# Patient Record
Sex: Male | Born: 1965 | Race: White | Hispanic: No | Marital: Married | Smoking: Never smoker
Health system: Southern US, Community
[De-identification: ages and names within clinical notes are randomized; demographics above are authoritative.]

## PROBLEM LIST (undated history)

## (undated) DIAGNOSIS — J9383 Other pneumothorax: Secondary | ICD-10-CM

## (undated) DIAGNOSIS — C801 Malignant (primary) neoplasm, unspecified: Secondary | ICD-10-CM

## (undated) DIAGNOSIS — E785 Hyperlipidemia, unspecified: Secondary | ICD-10-CM

## (undated) DIAGNOSIS — Z889 Allergy status to unspecified drugs, medicaments and biological substances status: Secondary | ICD-10-CM

## (undated) HISTORY — PX: TONSILLECTOMY: SUR1361

## (undated) HISTORY — PX: VASECTOMY: SHX75

## (undated) HISTORY — DX: Hyperlipidemia, unspecified: E78.5

## (undated) HISTORY — PX: KNEE ARTHROSCOPY: SHX127

## (undated) HISTORY — PX: BICEPS TENDON REPAIR: SHX566

## (undated) HISTORY — PX: ROTATOR CUFF REPAIR: SHX139

## (undated) HISTORY — PX: HERNIA REPAIR: SHX51

---

## 2000-04-17 ENCOUNTER — Other Ambulatory Visit: Admission: RE | Admit: 2000-04-17 | Discharge: 2000-04-17 | Payer: Self-pay | Admitting: Urology

## 2002-05-26 ENCOUNTER — Encounter: Admission: RE | Admit: 2002-05-26 | Discharge: 2002-06-29 | Payer: Self-pay | Admitting: *Deleted

## 2012-10-30 ENCOUNTER — Other Ambulatory Visit: Payer: Self-pay | Admitting: Specialist

## 2012-10-30 DIAGNOSIS — M25561 Pain in right knee: Secondary | ICD-10-CM

## 2012-11-03 ENCOUNTER — Ambulatory Visit
Admission: RE | Admit: 2012-11-03 | Discharge: 2012-11-03 | Disposition: A | Discharge status: Home / Self Care | Payer: Managed Care, Other (non HMO) | Source: Ambulatory Visit | Attending: Specialist | Admitting: Specialist

## 2012-11-03 DIAGNOSIS — M25561 Pain in right knee: Secondary | ICD-10-CM

## 2016-01-31 ENCOUNTER — Other Ambulatory Visit: Payer: Self-pay | Admitting: Urology

## 2016-03-04 ENCOUNTER — Encounter (HOSPITAL_COMMUNITY)
Admission: RE | Admit: 2016-03-04 | Discharge: 2016-03-04 | Disposition: A | Discharge status: Home / Self Care | Payer: 59 | Source: Ambulatory Visit | Attending: Urology | Admitting: Urology

## 2016-03-04 ENCOUNTER — Ambulatory Visit (HOSPITAL_COMMUNITY)
Admission: RE | Admit: 2016-03-04 | Discharge: 2016-03-04 | Disposition: A | Discharge status: Home / Self Care | Payer: 59 | Source: Ambulatory Visit | Attending: Urology | Admitting: Urology

## 2016-03-04 ENCOUNTER — Encounter (HOSPITAL_COMMUNITY): Payer: Self-pay

## 2016-03-04 DIAGNOSIS — C61 Malignant neoplasm of prostate: Secondary | ICD-10-CM

## 2016-03-04 DIAGNOSIS — Z01812 Encounter for preprocedural laboratory examination: Secondary | ICD-10-CM

## 2016-03-04 DIAGNOSIS — R001 Bradycardia, unspecified: Secondary | ICD-10-CM

## 2016-03-04 DIAGNOSIS — Z01810 Encounter for preprocedural cardiovascular examination: Secondary | ICD-10-CM | POA: Insufficient documentation

## 2016-03-04 DIAGNOSIS — Z01818 Encounter for other preprocedural examination: Secondary | ICD-10-CM | POA: Insufficient documentation

## 2016-03-04 HISTORY — DX: Allergy status to unspecified drugs, medicaments and biological substances: Z88.9

## 2016-03-04 HISTORY — DX: Other pneumothorax: J93.83

## 2016-03-04 HISTORY — DX: Malignant (primary) neoplasm, unspecified: C80.1

## 2016-03-04 LAB — CBC
HEMATOCRIT: 46.4 % (ref 39.0–52.0)
Hemoglobin: 15.9 g/dL (ref 13.0–17.0)
MCH: 30.3 pg (ref 26.0–34.0)
MCHC: 34.3 g/dL (ref 30.0–36.0)
MCV: 88.5 fL (ref 78.0–100.0)
PLATELETS: 174 10*3/uL (ref 150–400)
RBC: 5.24 MIL/uL (ref 4.22–5.81)
RDW: 13.1 % (ref 11.5–15.5)
WBC: 7.5 10*3/uL (ref 4.0–10.5)

## 2016-03-04 LAB — BASIC METABOLIC PANEL
Anion gap: 7 (ref 5–15)
BUN: 19 mg/dL (ref 6–20)
CALCIUM: 9.2 mg/dL (ref 8.9–10.3)
CO2: 27 mmol/L (ref 22–32)
CREATININE: 1.14 mg/dL (ref 0.61–1.24)
Chloride: 106 mmol/L (ref 101–111)
GFR calc Af Amer: >60 mL/min (ref >60)
Glucose, Bld: 96 mg/dL (ref 65–99)
POTASSIUM: 4.3 mmol/L (ref 3.5–5.1)
SODIUM: 140 mmol/L (ref 135–145)

## 2016-03-04 LAB — ABO/RH: ABO/RH(D): O NEG

## 2016-03-04 NOTE — Patient Instructions (Addendum)
Gilbert Norris  03/04/2016   Your procedure is scheduled on: 03-07-16  Report to [facility]  Entrance take [facility]  elevators to 3rd floor to  [place] [facility] [address]  AM.  Call this number if you have problems the morning of surgery [phone]  Bowel prep day before surgery(Magnesium Citrate and use 1 Fleet enema). Drink Clear liquids only plentiful.  Remember: ONLY 1 PERSON MAY GO WITH YOU TO SHORT STAY TO GET  READY MORNING OF YOUR SURGERY.  Do not eat food or drink liquids :After Midnight.     Take these medicines the morning of surgery with A SIP OF WATER:  Allegra -if need. DO NOT TAKE ANY DIABETIC MEDICATIONS DAY OF YOUR SURGERY                               You may not have any metal on your body including hair pins and              piercings  Do not wear jewelry, make-up, lotions, powders or perfumes, deodorant             Do not wear nail polish.  Do not shave  48 hours prior to surgery.              Men may shave face and neck.   Do not bring valuables to the hospital. [place].  Contacts, dentures or bridgework may not be worn into surgery.  Leave suitcase in the car. After surgery it may be brought to your room.     Patients discharged the day of surgery will not be allowed to drive home.  Name and phone number of your driver: Maudie Mercury- spouse [phone] cell  Special Instructions: N/A              Please read over the following fact sheets you were given: _____________________________________________________________________             [facility] - Preparing for Surgery Before surgery, you can play an important role.  Because skin is not sterile, your skin needs to be as free of germs as possible.  You can reduce the number of germs on your skin by washing with CHG (chlorahexidine gluconate) soap before surgery.  CHG is an antiseptic cleaner which kills germs and bonds with the  skin to continue killing germs even after washing. Please DO NOT use if you have an allergy to CHG or antibacterial soaps.  If your skin becomes reddened/irritated stop using the CHG and inform your nurse when you arrive at Short Stay. Do not shave (including legs and underarms) for at least 48 hours prior to the first CHG shower.  You may shave your face/neck. Please follow these instructions carefully:  1.  Shower with CHG Soap the night before surgery and the  morning of Surgery.  2.  If you choose to wash your hair, wash your hair first as usual with your  normal  shampoo.  3.  After you shampoo, rinse your hair and body thoroughly to remove the  shampoo.                           4.  Use CHG as you would any other liquid soap.  You can apply chg directly  to the skin and wash                       Gently with a scrungie or clean washcloth.  5.  Apply the CHG Soap to your body ONLY FROM THE NECK DOWN.   Do not use on face/ open                           Wound or open sores. Avoid contact with eyes, ears mouth and genitals (private parts).                       Wash face,  Genitals (private parts) with your normal soap.             6.  Wash thoroughly, paying special attention to the area where your surgery  will be performed.  7.  Thoroughly rinse your body with warm water from the neck down.  8.  DO NOT shower/wash with your normal soap after using and rinsing off  the CHG Soap.                9.  Pat yourself dry with a clean towel.            10.  Wear clean pajamas.            11.  Place clean sheets on your bed the night of your first shower and do not  sleep with pets. Day of Surgery : Do not apply any lotions/deodorants the morning of surgery.  Please wear clean clothes to the hospital/surgery center.  FAILURE TO FOLLOW THESE INSTRUCTIONS MAY RESULT IN THE CANCELLATION OF YOUR SURGERY PATIENT SIGNATURE_________________________________  NURSE  SIGNATURE__________________________________  ________________________________________________________________________   Adam Phenix  An incentive spirometer is a tool that can help keep your lungs clear and active. This tool measures how well you are filling your lungs with each breath. Taking long deep breaths may help reverse or decrease the chance of developing breathing (pulmonary) problems (especially infection) following:  A long period of time when you are unable to move or be active. BEFORE THE PROCEDURE   If the spirometer includes an indicator to show your best effort, your nurse or respiratory therapist will set it to a desired goal.  If possible, sit up straight or lean slightly forward. Try not to slouch.  Hold the incentive spirometer in an upright position. INSTRUCTIONS FOR USE  1. Sit on the edge of your bed if possible, or sit up as far as you can in bed or on a chair. 2. Hold the incentive spirometer in an upright position. 3. Breathe out normally. 4. Place the mouthpiece in your mouth and seal your lips tightly around it. 5. Breathe in slowly and as deeply as possible, raising the piston or the ball toward the top of the column. 6. Hold your breath for 3-5 seconds or for as long as possible. Allow the piston or ball to fall to the bottom of the column. 7. Remove the mouthpiece from your mouth and breathe out normally. 8. Rest for a few seconds and repeat Steps 1 through 7 at least 10 times every 1-2 hours when you are awake. Take your time and take a few normal breaths between deep breaths. 9. The spirometer may include an indicator to show your best  effort. Use the indicator as a goal to work toward during each repetition. 10. After each set of 10 deep breaths, practice coughing to be sure your lungs are clear. If you have an incision (the cut made at the time of surgery), support your incision when coughing by placing a pillow or rolled up towels firmly  against it. Once you are able to get out of bed, walk around indoors and cough well. You may stop using the incentive spirometer when instructed by your caregiver.  RISKS AND COMPLICATIONS  Take your time so you do not get dizzy or light-headed.  If you are in pain, you may need to take or ask for pain medication before doing incentive spirometry. It is harder to take a deep breath if you are having pain. AFTER USE  Rest and breathe slowly and easily.  It can be helpful to keep track of a log of your progress. Your caregiver can provide you with a simple table to help with this. If you are using the spirometer at home, follow these instructions: [place] IF:   You are having difficultly using the spirometer.  You have trouble using the spirometer as often as instructed.  Your pain medication is not giving enough relief while using the spirometer.  You develop fever of 100.5 F (38.1 C) or higher. SEEK IMMEDIATE MEDICAL CARE IF:   You cough up bloody sputum that had not been present before.  You develop fever of 102 F (38.9 C) or greater.  You develop worsening pain at or near the incision site. MAKE SURE YOU:   Understand these instructions.  Will watch your condition.  Will get help right away if you are not doing well or get worse. Document Released: 09/09/2006 Document Revised: 07/22/2011 Document Reviewed: 11/10/2006 [facility] Patient Information 2014 [place], [place].   ________________________________________________________________________

## 2016-03-04 NOTE — Pre-Procedure Instructions (Signed)
EKG done/ CXR done today per MD order.

## 2016-03-06 ENCOUNTER — Encounter (HOSPITAL_COMMUNITY): Payer: Self-pay | Admitting: Anesthesiology

## 2016-03-06 NOTE — Anesthesia Preprocedure Evaluation (Addendum)
Anesthesia Evaluation  Patient identified by MRN, date of birth, ID band Patient awake    Reviewed: Allergy & Precautions, NPO status , Patient's Chart, lab work & pertinent test results  Airway Mallampati: I  TM Distance: >3 FB Neck ROM: Full    Dental  (+) Teeth Intact, Dental Advisory Given   Pulmonary neg pulmonary ROS,    breath sounds clear to auscultation       Cardiovascular negative cardio ROS   Rhythm:Regular Rate:Normal     Neuro/Psych negative neurological ROS  negative psych ROS   GI/Hepatic negative GI ROS, Neg liver ROS,   Endo/Other  negative endocrine ROS  Renal/GU negative Renal ROS  negative genitourinary   Musculoskeletal negative musculoskeletal ROS (+)   Abdominal   Peds negative pediatric ROS (+)  Hematology negative hematology ROS (+)   Anesthesia Other Findings   Reproductive/Obstetrics negative OB ROS                            Lab Results  Component Value Date   WBC 7.5 03/04/2016   HGB 15.9 03/04/2016   HCT 46.4 03/04/2016   MCV 88.5 03/04/2016   PLT 174 03/04/2016   Lab Results  Component Value Date   CREATININE 1.14 03/04/2016   BUN 19 03/04/2016   NA 140 03/04/2016   K 4.3 03/04/2016   CL 106 03/04/2016   CO2 27 03/04/2016   No results found for: INR, PROTIME  EKG: sinus bradycardia, 1st degree AV block.  Anesthesia Physical Anesthesia Plan  ASA: II  Anesthesia Plan: General   Post-op Pain Management:    Induction: Intravenous  Airway Management Planned: Oral ETT  Additional Equipment:   Intra-op Plan:   Post-operative Plan: Extubation in OR  Informed Consent: I have reviewed the patients History and Physical, chart, labs and discussed the procedure including the risks, benefits and alternatives for the proposed anesthesia with the patient or authorized representative who has indicated his/her understanding and acceptance.    Dental advisory given  Plan Discussed with: CRNA  Anesthesia Plan Comments:         Anesthesia Quick Evaluation

## 2016-03-06 NOTE — H&P (Signed)
Office Visit Report     02/13/2016   --------------------------------------------------------------------------------   Gilbert Norris  MRN: [id number]  PRIMARY CARE:    DOB: 12/27/1965, 50 year old Male  REFERRING:  Raynelle Bring, MD  SSN: [id number]  PROVIDER:  Kathie Rhodes, M.D.    TREATING:  Raynelle Bring, M.D.    LOCATION:  [facility] [phone]   --------------------------------------------------------------------------------   CC/HPI: CC: Prostate Cancer   Physician requesting consult: Dr. Kathie Rhodes  PCP: Dr. Delfino Lovett   Gilbert Norris is a 50 year old gentleman who was found to have an elevated PSA of 4.27 and a prostate nodule at the left apex prompting a TRUS biopsy of the prostate on 01/03/16. This confirmed Gleason 3+4=7 adenocarcinoma of the prostate with 2 out of 12 biopsy cores positive for malignancy.   He is on testosterone replacement therapy with compounded testosterone gel. He began this 8 months ago for symptoms including severe fatigue, libido, erectile dysfunction, and poor exercise endurance. He has noted a significant improvement with all of these symptoms. His baseline testosterone was approximately 235. He has recently stopped testosterone replacement therapy considering his prostate cancer diagnosis.   Family history: None   Imaging studies: None   PMH: He has a history of hypercholesterolemia.  PSH: Inguinal hernia as a small child.   TNM stage: cT2a Nx Mx (L apex)  PSA: 4.27  Gleason score: 3+4=7  Biopsy (01/03/16): 2/12 cores positive  Left: L lateral mid (40%, 3+4=7, PNI), L mid (< 5%, 3+3=6)  Right: Benign  Prostate volume: 33/1 cc   Nomogram  OC disease: 53%  EPE: 47%  SVI: 3%  LNI: 3%  PFS (5 year, 10 year): 89%,81%   Urinary function: IPSS is 1.  Erectile function: SHIM score is 19. He is on testosterone replacement therapy and his erectile function is much improved. He previously took a very small amount of Levitra  prior to testosterone replacement therapy but no longer requires this medication.     ALLERGIES: None   MEDICATIONS: Crestor 20 mg tablet  Levitra 20 mg tablet     GU PSH: Prostate Needle Biopsy - 01/03/2016    NON-GU PSH: Knee Arthroscopy/surgery, Left Rotator Cuff Surgery, Right Surgical Pathology, Gross And Microscopic Examination For Prostate Needle - 01/03/2016    GU PMH: Prostate Cancer (Stable), I went over his pathology report with him today as well as the significance of his Gleason score, number and location of cores positive and percent of cores positive. We then discussed his Partin table results in detail and the significance of these predictions as far as prognosis and need for further workup are concerned. I then discussed with him the various options available including active surveillance and treatment for cure such as radiation and surgery. We briefly discussed the forms of radiation available. I also gave him written information outlining the disease, its workup and the options for treatment for him to review further. - 01/10/2016 Nodular prostate w/o LUTS, Left, There was a subtle firmness to the apex of the prostate on the left-hand side - 11/27/2015 Testicular hypofunction, He appears to have hypogonadism that is currently well controlled with a compounded testosterone product that I have recommended he remain on at this time. - 11/27/2015      PMH Notes: Adenocarcinoma prostate:   NON-GU PMH: Muscle weakness (generalized) - 02/06/2016, Muscle weakness (generalized) - 01/29/2016 Other lack of coordination - 02/06/2016 Hypertension    FAMILY HISTORY: Additional heart attack (anterior  wall) - Father   SOCIAL HISTORY: Marital Status: Married Current Smoking Status: Patient has never smoked.  Social Drinker.  Drinks 2 caffeinated drinks per day.    REVIEW OF SYSTEMS:    GU Review Male:   Patient denies frequent urination, hard to postpone urination, burning/ pain with  urination, get up at night to urinate, leakage of urine, stream starts and stops, trouble starting your streams, and have to strain to urinate .  Gastrointestinal (Upper):   Patient denies nausea and vomiting.  Gastrointestinal (Lower):   Patient denies diarrhea and constipation.  Constitutional:   Patient denies fever, night sweats, weight loss, and fatigue.  Skin:   Patient denies itching and skin rash/ lesion.  Eyes:   Patient denies blurred vision and double vision.  Ears/ Nose/ Throat:   Patient denies sore throat and sinus problems.  Hematologic/Lymphatic:   Patient denies swollen glands and easy bruising.  Cardiovascular:   Patient denies leg swelling and chest pains.  Respiratory:   Patient denies cough and shortness of breath.  Endocrine:   Patient denies excessive thirst.  Musculoskeletal:   Patient denies back pain and joint pain.  Neurological:   Patient denies headaches and dizziness.  Psychologic:   Patient denies depression and anxiety.   VITAL SIGNS:      02/13/2016 08:58 AM  BP 142/83 mmHg  Pulse 62 /min   MULTI-SYSTEM PHYSICAL EXAMINATION:    Constitutional: Well-nourished. No physical deformities. Normally developed. Good grooming.  Neck: Neck symmetrical, not swollen. Normal tracheal position.  Respiratory: Clear bilaterally.  Cardiovascular: Normal temperature, normal extremity pulses, no swelling, no varicosities. Regular rate and rhythm.  Lymphatic: No enlargement of neck, axillae, groin.  Skin: No paleness, no jaundice, no cyanosis. No lesion, no ulcer, no rash.  Neurologic / Psychiatric: Oriented to time, oriented to place, oriented to person. No depression, no anxiety, no agitation.  Gastrointestinal: No mass, no tenderness, no rigidity, non obese abdomen.  Eyes: Normal conjunctivae. Normal eyelids.  Ears, Nose, Mouth, and Throat: Left ear no scars, no lesions, no masses. Right ear no scars, no lesions, no masses. Nose no scars, no lesions, no masses. Normal  hearing. Normal lips.  Musculoskeletal: Normal gait and station of head and neck.     PAST DATA REVIEWED:  Source Of History:  Patient  Lab Test Review:   PSA  Records Review:   Pathology Reports, Previous Patient Records  Urine Test Review:   Urinalysis   01/10/16  Hormones  Testosterone, Total 407.2 pg/dL    PROCEDURES:          Urinalysis - 81003 Dipstick Dipstick Cont'd  Specimen: Voided Bilirubin: Neg  Color: Yellow Ketones: Neg  Appearance: Clear Blood: Neg  Specific Gravity: 1.025 Protein: Neg  pH: 5.0 Urobilinogen: 0.2  Glucose: Neg Nitrites: Neg    Leukocyte Esterase: Neg    ASSESSMENT:      ICD-10 Details  1 GU:   Prostate Cancer - C61    PLAN:           Schedule Return Visit: Keep Scheduled Appointment          Document Letter(s):  Created for Patient: Clinical Summary         Notes:   1. Prostate cancer: I had a detailed discussion with Gilbert Norris and his wife, Maudie Mercury, today. He is very well-informed and previously worked as a Chiropractor in surgery. He has done extensive research and has decided to proceed with surgical therapy and a robotic prostatectomy.  The patient was counseled about the natural history of prostate cancer and the standard treatment options that are available for prostate cancer. It was explained to him how his age and life expectancy, clinical stage, Gleason score, and PSA affect his prognosis, the decision to proceed with additional staging studies, as well as how that information influences recommended treatment strategies. We discussed the roles for active surveillance, radiation therapy, surgical therapy, androgen deprivation, as well as ablative therapy options for the treatment of prostate cancer as appropriate to his individual cancer situation. We discussed the risks and benefits of these options with regard to their impact on cancer control and also in terms of potential adverse events, complications, and impact on quality of life  particularly related to urinary and sexual function. The patient was encouraged to ask questions throughout the discussion today and all questions were answered to his stated satisfaction. In addition, the patient was provided with and/or directed to appropriate resources and literature for further education about prostate cancer and treatment options.   We discussed surgical therapy for prostate cancer including the different available surgical approaches. We discussed, in detail, the risks and expectations of surgery with regard to cancer control, urinary control, and erectile function as well as the expected postoperative recovery process. Additional risks of surgery including but not limited to bleeding, infection, hernia formation, nerve damage, lymphocele formation, bowel/rectal injury potentially necessitating colostomy, damage to the urinary tract resulting in urine leakage, urethral stricture, and the cardiopulmonary risks such as myocardial infarction, stroke, death, venothromboembolism, etc. were explained. The risk of open surgical conversion for robotic/laparoscopic prostatectomy was also discussed.   He has numerous good questions and these were all answered to his stated satisfaction. He does wish to proceed with surgical therapy nnd has been scheduled for a bilateral nerve sparing robot-assisted laparoscopic radical prostatectomy and bilateral pelvic lymphadenectomy.   2. Testosterone deficiency: We discussed the concern regarding testosterone deficiency in the setting of prostate cancer and particularly in the setting of surgically treated prostate cancer. He understands the importance of assessing his surgical pathology and his postoperative PSA. However, if his PSA becomes undetectable, he understands that the real risk of testosterone replacement therapy would likely be very low and likely very acceptable considering the significant benefits that he has received from treatment. We will  address this postoperatively.   Cc: Dr. Maury Dus         E & M CODE: I spent at least 80 minutes face to face with the patient, more than 50% of that time was spent on counseling and/or coordinating care.     * Signed by Raynelle Bring, M.D. on 02/13/16 at 2:16 PM (EDT)*

## 2016-03-07 ENCOUNTER — Encounter (HOSPITAL_COMMUNITY): Payer: Self-pay

## 2016-03-07 ENCOUNTER — Inpatient Hospital Stay (HOSPITAL_COMMUNITY): Payer: 59 | Admitting: Anesthesiology

## 2016-03-07 ENCOUNTER — Inpatient Hospital Stay (HOSPITAL_COMMUNITY)
Admission: RE | Admit: 2016-03-07 | Discharge: 2016-03-08 | DRG: 708 | Disposition: A | Discharge status: Home / Self Care | Payer: 59 | Source: Ambulatory Visit | Attending: Urology | Admitting: Urology

## 2016-03-07 ENCOUNTER — Encounter (HOSPITAL_COMMUNITY)
Admission: RE | Disposition: A | Discharge status: Home / Self Care | Payer: Self-pay | Source: Ambulatory Visit | Attending: Urology

## 2016-03-07 DIAGNOSIS — I10 Essential (primary) hypertension: Secondary | ICD-10-CM | POA: Diagnosis not present

## 2016-03-07 DIAGNOSIS — C61 Malignant neoplasm of prostate: Secondary | ICD-10-CM | POA: Diagnosis not present

## 2016-03-07 DIAGNOSIS — E7800 Pure hypercholesterolemia, unspecified: Secondary | ICD-10-CM | POA: Diagnosis present

## 2016-03-07 DIAGNOSIS — Z79899 Other long term (current) drug therapy: Secondary | ICD-10-CM

## 2016-03-07 HISTORY — PX: ROBOT ASSISTED LAPAROSCOPIC RADICAL PROSTATECTOMY: SHX5141

## 2016-03-07 HISTORY — PX: LYMPHADENECTOMY: SHX5960

## 2016-03-07 LAB — HEMOGLOBIN AND HEMATOCRIT, BLOOD
HEMATOCRIT: 45.2 % (ref 39.0–52.0)
HEMOGLOBIN: 15.6 g/dL (ref 13.0–17.0)

## 2016-03-07 LAB — TYPE AND SCREEN
ABO/RH(D): O NEG
ANTIBODY SCREEN: NEGATIVE

## 2016-03-07 SURGERY — XI ROBOTIC ASSISTED LAPAROSCOPIC RADICAL PROSTATECTOMY LEVEL 2
Anesthesia: General

## 2016-03-07 MED ORDER — CEFAZOLIN IN D5W 1 GM/50ML IV SOLN
1.0000 g | Freq: Three times a day (TID) | INTRAVENOUS | Status: AC
  Administered 2016-03-07 (×2): 1 g via INTRAVENOUS
  Filled 2016-03-07 (×2): qty 50

## 2016-03-07 MED ORDER — HEPARIN SODIUM (PORCINE) 1000 UNIT/ML IJ SOLN
INTRAMUSCULAR | Status: AC
  Filled 2016-03-07: qty 1

## 2016-03-07 MED ORDER — PROMETHAZINE HCL 25 MG/ML IJ SOLN: 6.2500 mg | INTRAMUSCULAR | Status: DC | PRN

## 2016-03-07 MED ORDER — PROPOFOL 10 MG/ML IV BOLUS
INTRAVENOUS | Status: AC
  Filled 2016-03-07: qty 20

## 2016-03-07 MED ORDER — LACTATED RINGERS IV SOLN
INTRAVENOUS | Status: DC | PRN
  Administered 2016-03-07: 08:00:00

## 2016-03-07 MED ORDER — DEXAMETHASONE SODIUM PHOSPHATE 10 MG/ML IJ SOLN
INTRAMUSCULAR | Status: DC | PRN
  Administered 2016-03-07: 10 mg via INTRAVENOUS

## 2016-03-07 MED ORDER — ACETAMINOPHEN 325 MG PO TABS: 650.0000 mg | ORAL_TABLET | ORAL | Status: DC | PRN

## 2016-03-07 MED ORDER — KETOROLAC TROMETHAMINE 15 MG/ML IJ SOLN
15.0000 mg | Freq: Four times a day (QID) | INTRAMUSCULAR | Status: DC
  Administered 2016-03-07 – 2016-03-08 (×5): 15 mg via INTRAVENOUS
  Filled 2016-03-07 (×5): qty 1

## 2016-03-07 MED ORDER — DOCUSATE SODIUM 100 MG PO CAPS
100.0000 mg | ORAL_CAPSULE | Freq: Two times a day (BID) | ORAL | Status: DC
  Filled 2016-03-07 (×3): qty 1

## 2016-03-07 MED ORDER — CEFAZOLIN SODIUM-DEXTROSE 2-4 GM/100ML-% IV SOLN
2.0000 g | INTRAVENOUS | Status: AC
  Administered 2016-03-07: 2 g via INTRAVENOUS
  Filled 2016-03-07: qty 100

## 2016-03-07 MED ORDER — MIDAZOLAM HCL 2 MG/2ML IJ SOLN
INTRAMUSCULAR | Status: AC
  Filled 2016-03-07: qty 2

## 2016-03-07 MED ORDER — ROCURONIUM BROMIDE 50 MG/5ML IV SOSY
PREFILLED_SYRINGE | INTRAVENOUS | Status: AC
  Filled 2016-03-07: qty 5

## 2016-03-07 MED ORDER — DIPHENHYDRAMINE HCL 12.5 MG/5ML PO ELIX: 12.5000 mg | ORAL_SOLUTION | Freq: Four times a day (QID) | ORAL | Status: DC | PRN

## 2016-03-07 MED ORDER — MEPERIDINE HCL 50 MG/ML IJ SOLN: 6.2500 mg | INTRAMUSCULAR | Status: DC | PRN

## 2016-03-07 MED ORDER — FENTANYL CITRATE (PF) 250 MCG/5ML IJ SOLN
INTRAMUSCULAR | Status: AC
  Filled 2016-03-07: qty 5

## 2016-03-07 MED ORDER — MIDAZOLAM HCL 5 MG/5ML IJ SOLN
INTRAMUSCULAR | Status: DC | PRN
  Administered 2016-03-07: 2 mg via INTRAVENOUS

## 2016-03-07 MED ORDER — SUGAMMADEX SODIUM 200 MG/2ML IV SOLN
INTRAVENOUS | Status: AC
  Filled 2016-03-07: qty 2

## 2016-03-07 MED ORDER — SUCCINYLCHOLINE CHLORIDE 20 MG/ML IJ SOLN
INTRAMUSCULAR | Status: AC
  Filled 2016-03-07: qty 1

## 2016-03-07 MED ORDER — DIPHENHYDRAMINE HCL 50 MG/ML IJ SOLN: 12.5000 mg | Freq: Four times a day (QID) | INTRAMUSCULAR | Status: DC | PRN

## 2016-03-07 MED ORDER — HYDROMORPHONE HCL 1 MG/ML IJ SOLN
INTRAMUSCULAR | Status: DC | PRN
  Administered 2016-03-07 (×2): 1 mg via INTRAVENOUS

## 2016-03-07 MED ORDER — ARTIFICIAL TEARS OP OINT
TOPICAL_OINTMENT | OPHTHALMIC | Status: AC
  Filled 2016-03-07: qty 3.5

## 2016-03-07 MED ORDER — SULFAMETHOXAZOLE-TRIMETHOPRIM 800-160 MG PO TABS: 1.0000 | ORAL_TABLET | Freq: Two times a day (BID) | ORAL | 0 refills | Status: DC

## 2016-03-07 MED ORDER — SUCCINYLCHOLINE CHLORIDE 200 MG/10ML IV SOSY
PREFILLED_SYRINGE | INTRAVENOUS | Status: DC | PRN
  Administered 2016-03-07: 100 mg via INTRAVENOUS

## 2016-03-07 MED ORDER — DEXAMETHASONE SODIUM PHOSPHATE 10 MG/ML IJ SOLN
INTRAMUSCULAR | Status: AC
  Filled 2016-03-07: qty 1

## 2016-03-07 MED ORDER — CEFAZOLIN SODIUM-DEXTROSE 2-4 GM/100ML-% IV SOLN
INTRAVENOUS | Status: AC
  Filled 2016-03-07: qty 100

## 2016-03-07 MED ORDER — ROSUVASTATIN CALCIUM 20 MG PO TABS
20.0000 mg | ORAL_TABLET | Freq: Every day | ORAL | Status: DC
  Administered 2016-03-07: 20 mg via ORAL
  Filled 2016-03-07: qty 1

## 2016-03-07 MED ORDER — BUPIVACAINE-EPINEPHRINE 0.25% -1:200000 IJ SOLN
INTRAMUSCULAR | Status: AC
  Filled 2016-03-07: qty 1

## 2016-03-07 MED ORDER — LACTATED RINGERS IV SOLN: INTRAVENOUS | Status: DC

## 2016-03-07 MED ORDER — HYDROMORPHONE HCL 2 MG/ML IJ SOLN
INTRAMUSCULAR | Status: AC
  Filled 2016-03-07: qty 1

## 2016-03-07 MED ORDER — LIDOCAINE 2% (20 MG/ML) 5 ML SYRINGE
INTRAMUSCULAR | Status: DC | PRN
Start: 2016-03-07 — End: 2016-03-07
  Administered 2016-03-07: 100 mg via INTRAVENOUS

## 2016-03-07 MED ORDER — SODIUM CHLORIDE 0.9 % IR SOLN
Status: DC | PRN
  Administered 2016-03-07: 1000 mL

## 2016-03-07 MED ORDER — BACITRACIN-NEOMYCIN-POLYMYXIN 400-5-5000 EX OINT: TOPICAL_OINTMENT | CUTANEOUS | Status: DC | PRN

## 2016-03-07 MED ORDER — LIDOCAINE 2% (20 MG/ML) 5 ML SYRINGE
INTRAMUSCULAR | Status: AC
  Filled 2016-03-07: qty 5

## 2016-03-07 MED ORDER — HYDROMORPHONE HCL 1 MG/ML IJ SOLN: 0.2500 mg | INTRAMUSCULAR | Status: DC | PRN

## 2016-03-07 MED ORDER — MORPHINE SULFATE (PF) 2 MG/ML IV SOLN: 2.0000 mg | INTRAVENOUS | Status: DC | PRN

## 2016-03-07 MED ORDER — ROCURONIUM BROMIDE 10 MG/ML (PF) SYRINGE
PREFILLED_SYRINGE | INTRAVENOUS | Status: DC | PRN
  Administered 2016-03-07: 20 mg via INTRAVENOUS
  Administered 2016-03-07: 50 mg via INTRAVENOUS

## 2016-03-07 MED ORDER — ONDANSETRON HCL 4 MG/2ML IJ SOLN
INTRAMUSCULAR | Status: DC | PRN
  Administered 2016-03-07: 4 mg via INTRAVENOUS

## 2016-03-07 MED ORDER — FENTANYL CITRATE (PF) 100 MCG/2ML IJ SOLN
INTRAMUSCULAR | Status: DC | PRN
  Administered 2016-03-07 (×2): 50 ug via INTRAVENOUS
  Administered 2016-03-07: 100 ug via INTRAVENOUS
  Administered 2016-03-07: 50 ug via INTRAVENOUS

## 2016-03-07 MED ORDER — ONDANSETRON HCL 4 MG/2ML IJ SOLN
INTRAMUSCULAR | Status: AC
  Filled 2016-03-07: qty 2

## 2016-03-07 MED ORDER — KCL IN DEXTROSE-NACL 20-5-0.45 MEQ/L-%-% IV SOLN
INTRAVENOUS | Status: DC
  Administered 2016-03-07 – 2016-03-08 (×3): via INTRAVENOUS
  Filled 2016-03-07 (×4): qty 1000

## 2016-03-07 MED ORDER — SODIUM CHLORIDE 0.9 % IV BOLUS (SEPSIS): 1000.0000 mL | Freq: Once | INTRAVENOUS | Status: DC

## 2016-03-07 MED ORDER — LACTATED RINGERS IV SOLN
INTRAVENOUS | Status: DC | PRN
  Administered 2016-03-07 (×2): via INTRAVENOUS

## 2016-03-07 MED ORDER — PROPOFOL 10 MG/ML IV BOLUS
INTRAVENOUS | Status: DC | PRN
  Administered 2016-03-07: 200 mg via INTRAVENOUS

## 2016-03-07 MED ORDER — SUGAMMADEX SODIUM 200 MG/2ML IV SOLN
INTRAVENOUS | Status: DC | PRN
  Administered 2016-03-07: 200 mg via INTRAVENOUS

## 2016-03-07 MED ORDER — BUPIVACAINE-EPINEPHRINE 0.25% -1:200000 IJ SOLN
INTRAMUSCULAR | Status: DC | PRN
  Administered 2016-03-07: 30 mL

## 2016-03-07 MED ORDER — HYDROCODONE-ACETAMINOPHEN 5-325 MG PO TABS: 1.0000 | ORAL_TABLET | Freq: Four times a day (QID) | ORAL | 0 refills | Status: DC | PRN

## 2016-03-07 MED ORDER — LORATADINE 10 MG PO TABS
10.0000 mg | ORAL_TABLET | Freq: Every day | ORAL | Status: DC
  Administered 2016-03-08: 10 mg via ORAL
  Filled 2016-03-07: qty 1

## 2016-03-07 SURGICAL SUPPLY — 52 items
APPLICATOR COTTON TIP 6IN STRL (MISCELLANEOUS) ×3 IMPLANT
CATH FOLEY 2WAY SLVR 18FR 30CC (CATHETERS) ×3 IMPLANT
CATH ROBINSON RED A/P 16FR (CATHETERS) ×3 IMPLANT
CATH ROBINSON RED A/P 8FR (CATHETERS) ×3 IMPLANT
CATH TIEMANN FOLEY 18FR 5CC (CATHETERS) ×3 IMPLANT
CHLORAPREP W/TINT 26ML (MISCELLANEOUS) ×3 IMPLANT
CLIP LIGATING HEM O LOK PURPLE (MISCELLANEOUS) ×6 IMPLANT
COVER SURGICAL LIGHT HANDLE (MISCELLANEOUS) ×3 IMPLANT
COVER TIP SHEARS 8 DVNC (MISCELLANEOUS) ×2 IMPLANT
COVER TIP SHEARS 8MM DA VINCI (MISCELLANEOUS) ×1
CUTTER ECHEON FLEX ENDO 45 340 (ENDOMECHANICALS) ×3 IMPLANT
DECANTER SPIKE VIAL GLASS SM (MISCELLANEOUS) ×3 IMPLANT
DERMABOND ADVANCED (GAUZE/BANDAGES/DRESSINGS) ×1
DERMABOND ADVANCED .7 DNX12 (GAUZE/BANDAGES/DRESSINGS) ×2 IMPLANT
DRAPE ARM DVNC X/XI (DISPOSABLE) ×8 IMPLANT
DRAPE COLUMN DVNC XI (DISPOSABLE) ×2 IMPLANT
DRAPE DA VINCI XI ARM (DISPOSABLE) ×4
DRAPE DA VINCI XI COLUMN (DISPOSABLE) ×1
DRAPE SURG IRRIG POUCH 19X23 (DRAPES) ×3 IMPLANT
DRSG TEGADERM 4X4.75 (GAUZE/BANDAGES/DRESSINGS) ×3 IMPLANT
ELECT REM PT RETURN 9FT ADLT (ELECTROSURGICAL) ×3
ELECTRODE REM PT RTRN 9FT ADLT (ELECTROSURGICAL) ×2 IMPLANT
GLOVE BIO SURGEON STRL SZ 6.5 (GLOVE) ×3 IMPLANT
GLOVE BIOGEL M STRL SZ7.5 (GLOVE) ×6 IMPLANT
GOWN STRL REUS W/TWL LRG LVL3 (GOWN DISPOSABLE) ×9 IMPLANT
HOLDER FOLEY CATH W/STRAP (MISCELLANEOUS) ×3 IMPLANT
IRRIG SUCT STRYKERFLOW 2 WTIP (MISCELLANEOUS) ×3
IRRIGATION SUCT STRKRFLW 2 WTP (MISCELLANEOUS) ×2 IMPLANT
IV LACTATED RINGERS 1000ML (IV SOLUTION) IMPLANT
NDL SAFETY ECLIPSE 18X1.5 (NEEDLE) ×2 IMPLANT
NEEDLE HYPO 18GX1.5 SHARP (NEEDLE) ×1
PACK ROBOT UROLOGY CUSTOM (CUSTOM PROCEDURE TRAY) ×3 IMPLANT
RELOAD GREEN ECHELON 45 (STAPLE) ×3 IMPLANT
SEAL CANN UNIV 5-8 DVNC XI (MISCELLANEOUS) ×8 IMPLANT
SEAL XI 5MM-8MM UNIVERSAL (MISCELLANEOUS) ×4
SOLUTION ELECTROLUBE (MISCELLANEOUS) ×3 IMPLANT
SUT ETHILON 3 0 PS 1 (SUTURE) ×3 IMPLANT
SUT MNCRL 3 0 RB1 (SUTURE) ×2 IMPLANT
SUT MNCRL 3 0 VIOLET RB1 (SUTURE) ×2 IMPLANT
SUT MNCRL AB 4-0 PS2 18 (SUTURE) ×6 IMPLANT
SUT MONOCRYL 3 0 RB1 (SUTURE) ×2
SUT VIC AB 0 CT1 27 (SUTURE) ×1
SUT VIC AB 0 CT1 27XBRD ANTBC (SUTURE) ×2 IMPLANT
SUT VIC AB 0 UR5 27 (SUTURE) ×3 IMPLANT
SUT VIC AB 2-0 SH 27 (SUTURE) ×1
SUT VIC AB 2-0 SH 27X BRD (SUTURE) ×2 IMPLANT
SUT VICRYL 0 UR6 27IN ABS (SUTURE) ×6 IMPLANT
SYR 27GX1/2 1ML LL SAFETY (SYRINGE) ×3 IMPLANT
TOWEL OR 17X26 10 PK STRL BLUE (TOWEL DISPOSABLE) ×3 IMPLANT
TOWEL OR NON WOVEN STRL DISP B (DISPOSABLE) ×3 IMPLANT
TUBING INSUFFLATION 10FT LAP (TUBING) IMPLANT
WATER STERILE IRR 1500ML POUR (IV SOLUTION) IMPLANT

## 2016-03-07 NOTE — Interval H&P Note (Signed)
History and Physical Interval Note:  03/07/2016 7:25 AM  Gilbert Norris  has presented today for surgery, with the diagnosis of PROSTATE CANCER  The various methods of treatment have been discussed with the patient and family. After consideration of risks, benefits and other options for treatment, the patient has consented to  Procedure(s): XI ROBOTIC ASSISTED LAPAROSCOPIC RADICAL PROSTATECTOMY LEVEL 2 (N/A) BILATERAL LYMPHADENECTOMY (Bilateral) as a surgical intervention .  The patient's history has been reviewed, patient examined, no change in status, stable for surgery.  I have reviewed the patient's chart and labs.  Questions were answered to the patient's satisfaction.     Gilbert Norris,LES

## 2016-03-07 NOTE — Anesthesia Postprocedure Evaluation (Signed)
Anesthesia Post Note  Patient: Gilbert Norris  Procedure(s) Performed: Procedure(s) (LRB): XI ROBOTIC ASSISTED LAPAROSCOPIC RADICAL PROSTATECTOMY LEVEL 2 (N/A) BILATERAL LYMPHADENECTOMY (Bilateral)  Patient location during evaluation: PACU Anesthesia Type: General Level of consciousness: awake and alert Pain management: pain level controlled Vital Signs Assessment: post-procedure vital signs reviewed and stable Respiratory status: spontaneous breathing, nonlabored ventilation, respiratory function stable and patient connected to nasal cannula oxygen Cardiovascular status: blood pressure returned to baseline and stable Postop Assessment: no signs of nausea or vomiting Anesthetic complications: no    Last Vitals:  Vitals:   03/07/16 1100 03/07/16 1130  BP: (!) 144/83 139/80  Pulse: 73 69  Resp: 13 14  Temp: 36.4 C 36.5 C    Last Pain:  Vitals:   03/07/16 1100  TempSrc:   PainSc: 0-No pain                 Effie Berkshire

## 2016-03-07 NOTE — Addendum Note (Signed)
Addendum  created 03/07/16 1234 by Lind Covert, CRNA   Anesthesia Intra Flowsheets edited

## 2016-03-07 NOTE — Progress Notes (Signed)
Post-op note  Subjective: The patient is doing well.  No complaints.  Denies N/V  Objective: Vital signs in last 24 hours: Temp:  [97.6 F (36.4 C)-98.7 F (37.1 C)] 98.7 F (37.1 C) (10/26 1300) Pulse Rate:  [61-78] 78 (10/26 1300) Resp:  [10-18] 12 (10/26 1300) BP: (132-146)/(68-86) 136/78 (10/26 1300) SpO2:  [95 %-100 %] 97 % (10/26 1300)  Intake/Output from previous day: No intake/output data recorded. Intake/Output this shift: Total I/O In: 1860 [I.V.:1800; Other:60] Out: 170 [Urine:70; Drains:50; Blood:50]  Physical Exam:  General: Alert and oriented. Abdomen: Soft, Nondistended. Incisions: Clean and dry. Urine:  red  Lab Results:  Recent Labs  03/07/16 1035  HGB 15.6  HCT 45.2    Assessment/Plan: POD#0   1) Continue to monitor  2) DVT prophy, clears, IS, amb, pain control   LOS: 0 days   Gilbert Norris 03/07/2016, 2:36 PM

## 2016-03-07 NOTE — Op Note (Signed)
Preoperative diagnosis: Clinically localized adenocarcinoma of the prostate (clinical stage T1c Nx Mx)  Postoperative diagnosis: Clinically localized adenocarcinoma of the prostate (clinical stage T1c Nx Mx)  Procedure:  1. Robotic assisted laparoscopic radical prostatectomy (bilateral nerve sparing) 2. Bilateral robotic assisted laparoscopic pelvic lymphadenectomy  Surgeon: Pryor Curia. M.D.  Assistant:  Debbrah Alar, PA-C  An assistant was required for this surgical procedure.  The duties of the assistant included but were not limited to suctioning, passing suture, camera manipulation, retraction. This procedure would not be able to be performed without an Environmental consultant.  Anesthesia: General  Complications: None  EBL: 50 mL  IVF:  1300 mL crystalloid  Specimens: 1. Prostate and seminal vesicles 2. Right pelvic lymph nodes 3. Left pelvic lymph nodes  Disposition of specimens: Pathology  Drains: 1. 20 Fr coude catheter 2. # 19 Blake pelvic drain  Indication: Gilbert Norris is a 50 y.o. year old patient with clinically localized prostate cancer.  After a thorough review of the management options for treatment of prostate cancer, he elected to proceed with surgical therapy and the above procedure(s).  We have discussed the potential benefits and risks of the procedure, side effects of the proposed treatment, the likelihood of the patient achieving the goals of the procedure, and any potential problems that might occur during the procedure or recuperation. Informed consent has been obtained.  Description of procedure:  The patient was taken to the operating room and a general anesthetic was administered. He was given preoperative antibiotics, placed in the dorsal lithotomy position, and prepped and draped in the usual sterile fashion. Next a preoperative timeout was performed. A urethral catheter was placed into the bladder and a site was selected near the umbilicus for  placement of the camera port. This was placed using a standard open Hassan technique which allowed entry into the peritoneal cavity under direct vision and without difficulty. An 8 mm robotic port was placed and a pneumoperitoneum established. The camera was then used to inspect the abdomen and there was no evidence of any intra-abdominal injuries or other abnormalities. The remaining abdominal ports were then placed. 8 mm robotic ports were placed in the right lower quadrant, left lower quadrant, and far left lateral abdominal wall. A 5 mm port was placed in the right upper quadrant and a 12 mm port was placed in the right lateral abdominal wall for laparoscopic assistance. All ports were placed under direct vision without difficulty. The surgical cart was then docked.   Utilizing the cautery scissors, the bladder was reflected posteriorly allowing entry into the space of Retzius and identification of the endopelvic fascia and prostate. The periprostatic fat was then removed from the prostate allowing full exposure of the endopelvic fascia. The endopelvic fascia was then incised from the apex back to the base of the prostate bilaterally and the underlying levator muscle fibers were swept laterally off the prostate thereby isolating the dorsal venous complex. The dorsal vein was then stapled and divided with a 45 mm Flex Echelon stapler. Attention then turned to the bladder neck which was divided anteriorly thereby allowing entry into the bladder and exposure of the urethral catheter. The catheter balloon was deflated and the catheter was brought into the operative field and used to retract the prostate anteriorly. The posterior bladder neck was then examined and was divided allowing further dissection between the bladder and prostate posteriorly until the vasa deferentia and seminal vessels were identified. The vasa deferentia were isolated, divided, and lifted  anteriorly. The seminal vesicles were dissected down  to their tips with care to control the seminal vascular arterial blood supply. These structures were then lifted anteriorly and the space between Denonvillier's fascia and the anterior rectum was developed with a combination of sharp and blunt dissection. This isolated the vascular pedicles of the prostate.  The lateral prostatic fascia was then sharply incised allowing release of the neurovascular bundles bilaterally. The vascular pedicles of the prostate were then ligated with Weck clips between the prostate and neurovascular bundles and divided with sharp cold scissor dissection resulting in neurovascular bundle preservation. The neurovascular bundles were then separated off the apex of the prostate and urethra bilaterally.  The urethra was then sharply transected allowing the prostate specimen to be disarticulated. The pelvis was copiously irrigated and hemostasis was ensured. There was no evidence for rectal injury.  Attention then turned to the right pelvic sidewall. The fibrofatty tissue between the external iliac vein, confluence of the iliac vessels, hypogastric artery, and Cooper's ligament was dissected free from the pelvic sidewall with care to preserve the obturator nerve. Weck clips were used for lymphostasis and hemostasis. An identical procedure was performed on the contralateral side and the lymphatic packets were removed for permanent pathologic analysis.  Attention then turned to the urethral anastomosis. A 2-0 Vicryl slip knot was placed between Denonvillier's fascia, the posterior bladder neck, and the posterior urethra to reapproximate these structures. A double-armed 3-0 Monocryl suture was then used to perform a 360 running tension-free anastomosis between the bladder neck and urethra. A new urethral catheter was then placed into the bladder and irrigated. There were no blood clots within the bladder and the anastomosis appeared to be watertight. A #19 Blake drain was then brought  through the left lateral 8 mm port site and positioned appropriately within the pelvis. It was secured to the skin with a nylon suture. The surgical cart was then undocked. The right lateral 12 mm port site was closed at the fascial level with a 0 Vicryl suture placed laparoscopically. All remaining ports were then removed under direct vision. The prostate specimen was removed intact within the Endopouch retrieval bag via the periumbilical camera port site. This fascial opening was closed with two running 0 Vicryl sutures. 0.25% Marcaine was then injected into all port sites and all incisions were reapproximated at the skin level with 4-0 Monocryl subcuticular sutures and Liquiband. The patient appeared to tolerate the procedure well and without complications. The patient was able to be extubated and transferred to the recovery unit in satisfactory condition.   Pryor Curia MD

## 2016-03-07 NOTE — Transfer of Care (Signed)
Immediate Anesthesia Transfer of Care Note  Patient: Gilbert Norris  Procedure(s) Performed: Procedure(s): XI ROBOTIC ASSISTED LAPAROSCOPIC RADICAL PROSTATECTOMY LEVEL 2 (N/A) BILATERAL LYMPHADENECTOMY (Bilateral)  Patient Location: PACU  Anesthesia Type:General  Level of Consciousness: sedated  Airway & Oxygen Therapy: Patient Spontanous Breathing and Patient connected to face mask oxygen  Post-op Assessment: Report given to RN and Post -op Vital signs reviewed and stable  Post vital signs: Reviewed and stable  Last Vitals:  Vitals:   03/07/16 0543  BP: (!) 146/86  Pulse: 62  Resp: 18  Temp: 36.9 C    Last Pain:  Vitals:   03/07/16 0543  TempSrc: Oral         Complications: No apparent anesthesia complications

## 2016-03-07 NOTE — Anesthesia Procedure Notes (Signed)
Procedure Name: Intubation Date/Time: 03/07/2016 7:31 AM Performed by: Lind Covert Pre-anesthesia Checklist: Patient identified, Emergency Drugs available, Suction available, Patient being monitored and Timeout performed Patient Re-evaluated:Patient Re-evaluated prior to inductionOxygen Delivery Method: Circle system utilized Preoxygenation: Pre-oxygenation with 100% oxygen Intubation Type: IV induction Laryngoscope Size: Mac and 4 Grade View: Grade I Tube type: Oral Tube size: 7.5 mm Number of attempts: 1 Airway Equipment and Method: Stylet Placement Confirmation: ETT inserted through vocal cords under direct vision,  positive ETCO2 and breath sounds checked- equal and bilateral Secured at: 1 cm Tube secured with: Tape Dental Injury: Teeth and Oropharynx as per pre-operative assessment

## 2016-03-07 NOTE — Discharge Instructions (Signed)

## 2016-03-08 LAB — HEMOGLOBIN AND HEMATOCRIT, BLOOD
HEMATOCRIT: 38.1 % — AB (ref 39.0–52.0)
HEMOGLOBIN: 13.2 g/dL (ref 13.0–17.0)

## 2016-03-08 MED ORDER — BISACODYL 10 MG RE SUPP
10.0000 mg | Freq: Once | RECTAL | Status: AC
  Administered 2016-03-08: 10 mg via RECTAL
  Filled 2016-03-08: qty 1

## 2016-03-08 MED ORDER — HYDROCODONE-ACETAMINOPHEN 5-325 MG PO TABS: 1.0000 | ORAL_TABLET | Freq: Four times a day (QID) | ORAL | Status: DC | PRN

## 2016-03-08 NOTE — Progress Notes (Signed)
Patient ID: Gilbert Norris, male   DOB: 11-11-1965, 50 y.o.   MRN: [id number]  1 Day Post-Op Subjective: The patient is doing well.  No nausea or vomiting. Pain is adequately controlled.  Objective: Vital signs in last 24 hours: Temp:  [97.6 F (36.4 C)-98.7 F (37.1 C)] 98 F (36.7 C) (10/27 0514) Pulse Rate:  [61-89] 89 (10/27 0514) Resp:  [10-18] 18 (10/27 0514) BP: (109-145)/(62-86) 135/62 (10/27 0514) SpO2:  [95 %-100 %] 98 % (10/27 0514) Weight:  [90.7 kg (200 lb)] 90.7 kg (200 lb) (10/26 1130)  Intake/Output from previous day: 10/26 0701 - 10/27 0700 In: 4437.5 [I.V.:4327.5; IV Piggyback:50] Out: [id number] [Urine:870; Drains:265; Blood:50] Intake/Output this shift: Total I/O In: -  Out: 1450 [Urine:1450]  Physical Exam:  General: Alert and oriented. CV: RRR Lungs: Clear bilaterally. GI: Soft, Nondistended. Incisions: Clean, dry, and intact Urine: Clear Extremities: Nontender, no erythema, no edema.  Lab Results:  Recent Labs  03/07/16 1035 03/08/16 0512  HGB 15.6 13.2  HCT 45.2 38.1*      Assessment/Plan: POD# 1 s/p robotic prostatectomy.  1) SL IVF 2) Ambulate, Incentive spirometry 3) Transition to oral pain medication 4) Dulcolax suppository 5) D/C pelvic drain 6) Plan for likely discharge later today   Gilbert Norris. MD   LOS: 1 day   Gilbert Norris,LES 03/08/2016, 7:42 AM

## 2016-03-08 NOTE — Progress Notes (Signed)
Patient discharged home with wife. Discharge paperwork explained to patient who verbalized understanding- printed prescriptions also given to patient. Pain well under control prior to D/C and patient had a small, soft, loose BM. Foley catheter intact and draining clear, pink urine to drainage bag with no complications. [facility] teaching completed including how to change to leg bag, how to empty drainage bags, keeping catheter clean, when to call doctor, keeping bag below level of bladder. Extra supplies given to patient. JP drain removed earlier this morning with no complications. Dressing C, D & I.

## 2016-03-08 NOTE — Discharge Summary (Signed)
  Date of admission: 03/07/2016  Date of discharge: 03/08/2016  Admission diagnosis: Prostate Cancer  Discharge diagnosis: Prostate Cancer  History and Physical: For full details, please see admission history and physical. Briefly, Gilbert Norris is a 50 y.o. gentleman with localized prostate cancer.  After discussing management/treatment options, he elected to proceed with surgical treatment.  Hospital Course: Gilbert Norris was taken to the operating room on 03/07/2016 and underwent a robotic assisted laparoscopic radical prostatectomy. He tolerated this procedure well and without complications. Postoperatively, he was able to be transferred to a regular hospital room following recovery from anesthesia.  He was able to begin ambulating the night of surgery. He remained hemodynamically stable overnight.  He had excellent urine output with appropriately minimal output from his pelvic drain and his pelvic drain was removed on POD #1.  He was transitioned to oral pain medication, tolerated a clear liquid diet, and had met all discharge criteria and was able to be discharged home later on POD#1.  Laboratory values:  Recent Labs  03/07/16 1035 03/08/16 0512  HGB 15.6 13.2  HCT 45.2 38.1*    Disposition: Home  Discharge instruction: He was instructed to be ambulatory but to refrain from heavy lifting, strenuous activity, or driving. He was instructed on urethral catheter care.  Discharge medications:    Medication List    STOP taking these medications   CoQ-10 200 MG Caps   ibuprofen 200 MG tablet Commonly known as:  ADVIL,MOTRIN     TAKE these medications   fexofenadine 180 MG tablet Commonly known as:  ALLEGRA Take 180 mg by mouth daily.   HYDROcodone-acetaminophen 5-325 MG tablet Commonly known as:  NORCO Take 1-2 tablets by mouth every 6 (six) hours as needed for moderate pain or severe pain.   rosuvastatin 20 MG tablet Commonly known as:  CRESTOR Take 20 mg  by mouth at bedtime.   sulfamethoxazole-trimethoprim 800-160 MG tablet Commonly known as:  BACTRIM DS,SEPTRA DS Take 1 tablet by mouth 2 (two) times daily. Start the day prior to foley removal appointment       Followup: He will followup in 1 week for catheter removal and to discuss his surgical pathology results.

## 2018-10-21 ENCOUNTER — Other Ambulatory Visit: Payer: Self-pay

## 2018-10-21 ENCOUNTER — Ambulatory Visit (INDEPENDENT_AMBULATORY_CARE_PROVIDER_SITE_OTHER): Payer: 59 | Admitting: *Deleted

## 2018-10-21 VITALS — BP 110/60 | HR 59 | Wt 196.8 lb

## 2018-10-21 DIAGNOSIS — R072 Precordial pain: Secondary | ICD-10-CM | POA: Diagnosis not present

## 2018-10-21 DIAGNOSIS — R0602 Shortness of breath: Secondary | ICD-10-CM | POA: Diagnosis not present

## 2018-10-21 NOTE — Progress Notes (Signed)
1.) Reason for visit: EKG  2.) Name of MD requesting visit: crenshaw  3.) H&P: EKG prior to virtual new patient appointment for SOB and Chest pain  4.) ROS related to problem: patient has no complaints today  5.) Assessment and plan per MD: virtual visit 10-27-2018

## 2018-10-22 ENCOUNTER — Telehealth: Payer: Self-pay | Admitting: Cardiology

## 2018-10-22 NOTE — Telephone Encounter (Signed)
smartphone/ consent/ my chart active/ pre reg completed °

## 2018-10-26 NOTE — Progress Notes (Signed)
Virtual Visit via Video Note   This visit type was conducted due to national recommendations for restrictions regarding the COVID-19 Pandemic (e.g. social distancing) in an effort to limit this patient's exposure and mitigate transmission in our community.  Due to his co-morbid illnesses, this patient is at least at moderate risk for complications without adequate follow up.  This format is felt to be most appropriate for this patient at this time.  All issues noted in this document were discussed and addressed.  A limited physical exam was performed with this format.  Please refer to the patient's chart for his consent to telehealth for [facility].   Date:  10/27/2018   ID:  Gilbert Norris, DOB 04/06/1966, MRN [id number]  Patient Location: Home Provider Location: Home  PCP:  Maury Dus, MD  Cardiologist:  Dr Stanford Breed  Evaluation Performed:  Consultation - Gilbert Norris was referred by Maury Dus for the evaluation of dyspnea.  Chief Complaint:  Dyspnea  History of Present Illness:    Gilbert Norris is a 53 y.o. male with medical history of spontaneous pneumothorax, prostate cancer for evaluation of dyspnea at request of Maury Dus, MD. Patient is typically very active.  Recently he ran up a hill and became significantly short of breath followed by dizziness.  There was no palpitations or chest pain.  He sat down and it took him several minutes to improve.  He has a strong family history of coronary disease and wanted further evaluation.  He typically does not have dyspnea on exertion, orthopnea, PND, pedal edema, exertional chest pain and does not have a history of syncope.  Cardiology now asked to evaluate.  The patient does not have symptoms concerning for COVID-19 infection (fever, chills, cough, or new shortness of breath).    Past Medical History:  Diagnosis Date  . Cancer [facility])    Prostate cancer dx. 01-03-16 Biopsy  . H/O seasonal allergies   .  Hyperlipidemia   . Spontaneous pneumothorax    age 20 " told has a bleb" left lung-no breathing issues   Past Surgical History:  Procedure Laterality Date  . BICEPS TENDON REPAIR Left    Gramig  . HERNIA REPAIR    . KNEE ARTHROSCOPY Right    ACL reapair  . LYMPHADENECTOMY Bilateral 03/07/2016   Procedure: BILATERAL LYMPHADENECTOMY;  Surgeon: Raynelle Bring, MD;  Location: [facility];  Service: Urology;  Laterality: Bilateral;  . ROBOT ASSISTED LAPAROSCOPIC RADICAL PROSTATECTOMY N/A 03/07/2016   Procedure: XI ROBOTIC ASSISTED LAPAROSCOPIC RADICAL PROSTATECTOMY LEVEL 2;  Surgeon: Raynelle Bring, MD;  Location: [facility]S;  Service: Urology;  Laterality: N/A;  . ROTATOR CUFF REPAIR Right    AC joint  . TONSILLECTOMY    . VASECTOMY       Current Meds  Medication Sig  . fexofenadine (ALLEGRA) 180 MG tablet Take 180 mg by mouth daily.  Marland Kitchen HYDROcodone-acetaminophen (NORCO) 5-325 MG tablet Take 1-2 tablets by mouth every 6 (six) hours as needed for moderate pain or severe pain.  . rosuvastatin (CRESTOR) 20 MG tablet Take 20 mg by mouth at bedtime.   . [DISCONTINUED] sulfamethoxazole-trimethoprim (BACTRIM DS,SEPTRA DS) 800-160 MG tablet Take 1 tablet by mouth 2 (two) times daily. Start the day prior to foley removal appointment     Allergies:   Patient has no known allergies.   Social History   Tobacco Use  . Smoking status: Never Smoker  . Smokeless tobacco: Never Used  Substance Use Topics  . Alcohol  use: Yes    Comment: rare-social  . Drug use: No     Family Hx: The patient's family history includes CAD in his father and paternal grandmother.  ROS:   Please see the history of present illness.    No fevers, chills or productive cough. All other systems reviewed and are negative.  Labs/Other Tests and Data Reviewed:    EKG:  An ECG dated 10/21/18 was personally reviewed today and demonstrated:  Sinus bradycardia with no ST changes.  Wt Readings from Last 3 Encounters:  10/27/18 200  lb (90.7 kg)  10/21/18 196 lb 12.8 oz (89.3 kg)  03/07/16 200 lb (90.7 kg)     Objective:    Vital Signs:  Ht 6\' 1"  (1.854 m)   Wt 200 lb (90.7 kg)   BMI 26.39 kg/m    VITAL SIGNS:  reviewed  No acute distress Answers questions appropriately Normal affect Remainder physical examination not performed (telehealth visit; coronavirus pandemic)  ASSESSMENT & PLAN:    1. Dyspnea-patient had an episode of dyspnea with running up a hill followed by dizziness.  He has a strong family history of coronary disease with father who died of a myocardial infarction in his mid 30s.  I will arrange an exercise treadmill for risk stratification as well as a calcium score.  Schedule echocardiogram to assess LV function. 2. Hyperlipidemia-continue statin.  Lipids and liver are monitored by primary care.  COVID-19 Education: The importance of social distancing was discussed today.  Time:   Today, I have spent 30 minutes with the patient with telehealth technology discussing the above problems.     Medication Adjustments/Labs and Tests Ordered: Current medicines are reviewed at length with the patient today.  Concerns regarding medicines are outlined above.   Tests Ordered: No orders of the defined types were placed in this encounter.   Medication Changes: No orders of the defined types were placed in this encounter.   Follow Up:  Virtual Visit or In Person in 6 month(s)  Signed, Kirk Ruths, MD  10/27/2018 9:56 AM    [place]

## 2018-10-27 ENCOUNTER — Telehealth (INDEPENDENT_AMBULATORY_CARE_PROVIDER_SITE_OTHER): Payer: 59 | Admitting: Cardiology

## 2018-10-27 ENCOUNTER — Encounter: Payer: Self-pay | Admitting: Cardiology

## 2018-10-27 VITALS — Ht 73.0 in | Wt 200.0 lb

## 2018-10-27 DIAGNOSIS — E7800 Pure hypercholesterolemia, unspecified: Secondary | ICD-10-CM

## 2018-10-27 DIAGNOSIS — R0600 Dyspnea, unspecified: Secondary | ICD-10-CM | POA: Diagnosis not present

## 2018-10-27 NOTE — Patient Instructions (Signed)
Medication Instructions:  NO CHANGE If you need a refill on your cardiac medications before your next appointment, please call your pharmacy.   Lab work: If you have labs (blood work) drawn today and your tests are completely normal, you will receive your results only by: Marland Kitchen MyChart Message (if you have MyChart) OR . A paper copy in the mail If you have any lab test that is abnormal or we need to change your treatment, we will call you to review the results.  Testing/Procedures: Your physician has requested that you have an exercise tolerance test. For further information please visit [email or web address]. Please also follow instruction sheet, as given.[place] physician has requested that you have an echocardiogram. Echocardiography is a painless test that uses sound waves to create images of your heart. It provides your doctor with information about the size and shape of your heart and how well your heart's chambers and valves are working. This procedure takes approximately one hour. There are no restrictions for this procedure.  [place] [facility] [place]    Follow-Up: At [facility], you and your health needs are our priority.  As part of our continuing mission to provide you with exceptional heart care, we have created designated Provider Care Teams.  These Care Teams include your primary Cardiologist (physician) and Advanced Practice Providers (APPs -  Physician Assistants and Nurse Practitioners) who all work together to provide you with the care you need, when you need it. You will need a follow up appointment in 6 months.  Please call our office 2 months in advance to schedule this appointment.  You may see Kirk Ruths MD or one of the following Advanced Practice Providers on your designated Care Team:   Kerin Ransom, PA-C Roby Lofts, [place] . Sande Rives, PA-C

## 2018-11-17 ENCOUNTER — Other Ambulatory Visit: Payer: Self-pay

## 2018-11-17 ENCOUNTER — Ambulatory Visit (HOSPITAL_COMMUNITY): Payer: 59 | Attending: Cardiology

## 2018-11-17 ENCOUNTER — Ambulatory Visit (INDEPENDENT_AMBULATORY_CARE_PROVIDER_SITE_OTHER)
Admission: RE | Admit: 2018-11-17 | Discharge: 2018-11-17 | Disposition: A | Discharge status: Home / Self Care | Payer: Self-pay | Source: Ambulatory Visit | Attending: Cardiology | Admitting: Cardiology

## 2018-11-17 DIAGNOSIS — R0600 Dyspnea, unspecified: Secondary | ICD-10-CM

## 2018-11-18 ENCOUNTER — Telehealth: Payer: Self-pay | Admitting: *Deleted

## 2018-11-18 MED ORDER — ASPIRIN EC 81 MG PO TBEC: 81.0000 mg | DELAYED_RELEASE_TABLET | Freq: Every day | ORAL | 3 refills | Status: AC

## 2018-11-18 NOTE — Telephone Encounter (Addendum)
pt aware of results   ----- Message from Lelon Perla, MD sent at 11/17/2018  6:15 PM EDT ----- Minimally elevated Ca score; add asa 81 mg daily; FU chest CT 12 months for small nodules Kirk Ruths

## 2019-05-27 ENCOUNTER — Telehealth: Payer: Self-pay | Admitting: *Deleted

## 2019-05-27 NOTE — Telephone Encounter (Signed)
A message was left, re: his follow up visit. 

## 2019-06-09 NOTE — Progress Notes (Deleted)
HPI: FU dyspnea.  Echocardiogram July 2020 showed normal LV systolic function, mild left ventricular hypertrophy, mild left atrial enlargement, mild aortic insufficiency.  Calcium score July 2000 2016.  Current Outpatient Medications  Medication Sig Dispense Refill  . aspirin EC 81 MG tablet Take 1 tablet (81 mg total) by mouth daily. 90 tablet 3  . fexofenadine (ALLEGRA) 180 MG tablet Take 180 mg by mouth daily.    Marland Kitchen HYDROcodone-acetaminophen (NORCO) 5-325 MG tablet Take 1-2 tablets by mouth every 6 (six) hours as needed for moderate pain or severe pain. 30 tablet 0  . rosuvastatin (CRESTOR) 20 MG tablet Take 20 mg by mouth at bedtime.      No current facility-administered medications for this visit.     Past Medical History:  Diagnosis Date  . Cancer [facility])    Prostate cancer dx. 01-03-16 Biopsy  . H/O seasonal allergies   . Hyperlipidemia   . Spontaneous pneumothorax    age 54 " told has a bleb" left lung-no breathing issues    Past Surgical History:  Procedure Laterality Date  . BICEPS TENDON REPAIR Left    Gramig  . HERNIA REPAIR    . KNEE ARTHROSCOPY Right    ACL reapair  . LYMPHADENECTOMY Bilateral 03/07/2016   Procedure: BILATERAL LYMPHADENECTOMY;  Surgeon: Raynelle Bring, MD;  Location: [facility];  Service: Urology;  Laterality: Bilateral;  . ROBOT ASSISTED LAPAROSCOPIC RADICAL PROSTATECTOMY N/A 03/07/2016   Procedure: XI ROBOTIC ASSISTED LAPAROSCOPIC RADICAL PROSTATECTOMY LEVEL 2;  Surgeon: Raynelle Bring, MD;  Location: [facility];  Service: Urology;  Laterality: N/A;  . ROTATOR CUFF REPAIR Right    AC joint  . TONSILLECTOMY    . VASECTOMY      Social History   Socioeconomic History  . Marital status: Married    Spouse name: Not on file  . Number of children: 2  . Years of education: Not on file  . Highest education level: Not on file  Occupational History  . Not on file  Tobacco Use  . Smoking status: Never Smoker  . Smokeless tobacco: Never Used   Substance and Sexual Activity  . Alcohol use: Yes    Comment: rare-social  . Drug use: No  . Sexual activity: Yes  Other Topics Concern  . Not on file  Social History Narrative  . Not on file   Social Determinants of Health   Financial Resource Strain:   . Difficulty of Paying Living Expenses: Not on file  Food Insecurity:   . Worried About Charity fundraiser in the Last Year: Not on file  . Ran Out of Food in the Last Year: Not on file  Transportation Needs:   . Lack of Transportation (Medical): Not on file  . Lack of Transportation (Non-Medical): Not on file  Physical Activity:   . Days of Exercise per Week: Not on file  . Minutes of Exercise per Session: Not on file  Stress:   . Feeling of Stress : Not on file  Social Connections:   . Frequency of Communication with Friends and Family: Not on file  . Frequency of Social Gatherings with Friends and Family: Not on file  . Attends Religious Services: Not on file  . Active Member of Clubs or Organizations: Not on file  . Attends Archivist Meetings: Not on file  . Marital Status: Not on file  Intimate Partner Violence:   . Fear of Current or Ex-Partner: Not on file  .  Emotionally Abused: Not on file  . Physically Abused: Not on file  . Sexually Abused: Not on file    Family History  Problem Relation Age of Onset  . CAD Father   . CAD Paternal Grandmother     ROS: no fevers or chills, productive cough, hemoptysis, dysphasia, odynophagia, melena, hematochezia, dysuria, hematuria, rash, seizure activity, orthopnea, PND, pedal edema, claudication. Remaining systems are negative.  Physical Exam: Well-developed well-nourished in no acute distress.  Skin is warm and dry.  HEENT is normal.  Neck is supple.  Chest is clear to auscultation with normal expansion.  Cardiovascular exam is regular rate and rhythm.  Abdominal exam nontender or distended. No masses palpated. Extremities show no edema. neuro grossly  intact  ECG- personally reviewed  A/P  1 dyspnea-  2 coronary calcification-  3 hyperlipidemia-continue statin.  Kirk Ruths, MD

## 2019-06-16 ENCOUNTER — Ambulatory Visit: Payer: 59 | Admitting: Cardiology

## 2019-06-17 NOTE — Progress Notes (Signed)
HPI: FU dyspnea. Echocardiogram July 2020 showed normal LV systolic function, mild left ventricular hypertrophy, mild left atrial enlargement, mild aortic insufficiency. Calcium score July 2020 16. There was note of small bilateral pulmonary nodules and follow-up recommended 12 months. Since last seen the patient denies any dyspnea on exertion, orthopnea, PND, pedal edema, palpitations, syncope or chest pain.   Current Outpatient Medications  Medication Sig Dispense Refill  . aspirin EC 81 MG tablet Take 1 tablet (81 mg total) by mouth daily. 90 tablet 3  . fexofenadine (ALLEGRA) 180 MG tablet Take 180 mg by mouth daily.    Marland Kitchen HYDROcodone-acetaminophen (NORCO) 5-325 MG tablet Take 1-2 tablets by mouth every 6 (six) hours as needed for moderate pain or severe pain. 30 tablet 0  . rosuvastatin (CRESTOR) 20 MG tablet Take 20 mg by mouth at bedtime.      No current facility-administered medications for this visit.     Past Medical History:  Diagnosis Date  . Cancer [facility])    Prostate cancer dx. 01-03-16 Biopsy  . H/O seasonal allergies   . Hyperlipidemia   . Spontaneous pneumothorax    age 54 " told has a bleb" left lung-no breathing issues    Past Surgical History:  Procedure Laterality Date  . BICEPS TENDON REPAIR Left    Gramig  . HERNIA REPAIR    . KNEE ARTHROSCOPY Right    ACL reapair  . LYMPHADENECTOMY Bilateral 03/07/2016   Procedure: BILATERAL LYMPHADENECTOMY;  Surgeon: Raynelle Bring, MD;  Location: [facility];  Service: Urology;  Laterality: Bilateral;  . ROBOT ASSISTED LAPAROSCOPIC RADICAL PROSTATECTOMY N/A 03/07/2016   Procedure: XI ROBOTIC ASSISTED LAPAROSCOPIC RADICAL PROSTATECTOMY LEVEL 2;  Surgeon: Raynelle Bring, MD;  Location: [facility];  Service: Urology;  Laterality: N/A;  . ROTATOR CUFF REPAIR Right    AC joint  . TONSILLECTOMY    . VASECTOMY      Social History   Socioeconomic History  . Marital status: Married    Spouse name: Not on file  . Number of  children: 2  . Years of education: Not on file  . Highest education level: Not on file  Occupational History  . Not on file  Tobacco Use  . Smoking status: Never Smoker  . Smokeless tobacco: Never Used  Substance and Sexual Activity  . Alcohol use: Yes    Comment: rare-social  . Drug use: No  . Sexual activity: Yes  Other Topics Concern  . Not on file  Social History Narrative  . Not on file   Social Determinants of Health   Financial Resource Strain:   . Difficulty of Paying Living Expenses: Not on file  Food Insecurity:   . Worried About Charity fundraiser in the Last Year: Not on file  . Ran Out of Food in the Last Year: Not on file  Transportation Needs:   . Lack of Transportation (Medical): Not on file  . Lack of Transportation (Non-Medical): Not on file  Physical Activity:   . Days of Exercise per Week: Not on file  . Minutes of Exercise per Session: Not on file  Stress:   . Feeling of Stress : Not on file  Social Connections:   . Frequency of Communication with Friends and Family: Not on file  . Frequency of Social Gatherings with Friends and Family: Not on file  . Attends Religious Services: Not on file  . Active Member of Clubs or Organizations: Not on file  .  Attends Archivist Meetings: Not on file  . Marital Status: Not on file  Intimate Partner Violence:   . Fear of Current or Ex-Partner: Not on file  . Emotionally Abused: Not on file  . Physically Abused: Not on file  . Sexually Abused: Not on file    Family History  Problem Relation Age of Onset  . CAD Father   . CAD Paternal Grandmother     ROS: no fevers or chills, productive cough, hemoptysis, dysphasia, odynophagia, melena, hematochezia, dysuria, hematuria, rash, seizure activity, orthopnea, PND, pedal edema, claudication. Remaining systems are negative.  Physical Exam: Well-developed well-nourished in no acute distress.  Skin is warm and dry.  HEENT is normal.  Neck is supple.   Chest is clear to auscultation with normal expansion.  Cardiovascular exam is regular rate and rhythm.  Abdominal exam nontender or distended. No masses palpated. Extremities show no edema. neuro grossly intact  A/P  1 dyspnea-No further symptoms; will no pursue further W/U.  2 coronary calcification-continue aspirin and statin.  3 hyperlipidemia-continue statin.  Lipids and liver monitored by primary care.  4 pulmonary nodules-plan follow-up noncontrast chest CT July 2021.  5 aortic insufficiency-mild on most recent echocardiogram.  We will plan follow-up studies in the future.  Kirk Ruths, MD

## 2019-06-24 ENCOUNTER — Encounter: Payer: Self-pay | Admitting: Cardiology

## 2019-06-24 ENCOUNTER — Other Ambulatory Visit: Payer: Self-pay

## 2019-06-24 ENCOUNTER — Ambulatory Visit (INDEPENDENT_AMBULATORY_CARE_PROVIDER_SITE_OTHER): Payer: 59 | Admitting: Cardiology

## 2019-06-24 VITALS — BP 136/74 | HR 65 | Temp 97.7°F | Ht 73.0 in | Wt 201.0 lb

## 2019-06-24 DIAGNOSIS — R911 Solitary pulmonary nodule: Secondary | ICD-10-CM

## 2019-06-24 DIAGNOSIS — I351 Nonrheumatic aortic (valve) insufficiency: Secondary | ICD-10-CM | POA: Diagnosis not present

## 2019-06-24 DIAGNOSIS — R0600 Dyspnea, unspecified: Secondary | ICD-10-CM

## 2019-06-24 NOTE — Patient Instructions (Signed)

## 2019-11-09 ENCOUNTER — Encounter: Payer: Self-pay | Admitting: *Deleted

## 2019-11-09 DIAGNOSIS — R911 Solitary pulmonary nodule: Secondary | ICD-10-CM

## 2019-11-25 ENCOUNTER — Ambulatory Visit (INDEPENDENT_AMBULATORY_CARE_PROVIDER_SITE_OTHER): Payer: 59

## 2019-11-25 ENCOUNTER — Other Ambulatory Visit: Payer: Self-pay

## 2019-11-25 DIAGNOSIS — R911 Solitary pulmonary nodule: Secondary | ICD-10-CM | POA: Diagnosis not present

## 2020-09-19 NOTE — Progress Notes (Signed)
Virtual Visit via Video Note   This visit type was conducted due to national recommendations for restrictions regarding the COVID-19 Pandemic (e.g. social distancing) in an effort to limit this patient's exposure and mitigate transmission in our community.  Due to his co-morbid illnesses, this patient is at least at moderate risk for complications without adequate follow up.  This format is felt to be most appropriate for this patient at this time.  All issues noted in this document were discussed and addressed.  A limited physical exam was performed with this format.  Please refer to the patient's chart for his consent to telehealth for [facility].      Date:  09/28/2020   ID:  Gilbert Norris, DOB Nov 14, 1965, MRN [id number]  Patient Location:Home Provider Location: Home  PCP:  Maury Dus, MD  Cardiologist:  Dr Stanford Breed  Evaluation Performed:  Follow-Up Visit  Chief Complaint:  FU dyspnea  History of Present Illness:    FU dyspnea. Echocardiogram July 2020 showed normal LV systolic function, mild left ventricular hypertrophy, mild left atrial enlargement, mild aortic insufficiency. Calcium score July 2020 16. Since last seen the patient denies any dyspnea on exertion, orthopnea, PND, pedal edema, palpitations, syncope or chest pain.   The patient does not have symptoms concerning for COVID-19 infection (fever, chills, cough, or new shortness of breath).    Past Medical History:  Diagnosis Date  . Cancer [facility])    Prostate cancer dx. 01-03-16 Biopsy  . H/O seasonal allergies   . Hyperlipidemia   . Spontaneous pneumothorax    age 10 " told has a bleb" left lung-no breathing issues   Past Surgical History:  Procedure Laterality Date  . BICEPS TENDON REPAIR Left    Gramig  . HERNIA REPAIR    . KNEE ARTHROSCOPY Right    ACL reapair  . LYMPHADENECTOMY Bilateral 03/07/2016   Procedure: BILATERAL LYMPHADENECTOMY;  Surgeon: Raynelle Bring, MD;  Location: [facility];   Service: Urology;  Laterality: Bilateral;  . ROBOT ASSISTED LAPAROSCOPIC RADICAL PROSTATECTOMY N/A 03/07/2016   Procedure: XI ROBOTIC ASSISTED LAPAROSCOPIC RADICAL PROSTATECTOMY LEVEL 2;  Surgeon: Raynelle Bring, MD;  Location: [facility];  Service: Urology;  Laterality: N/A;  . ROTATOR CUFF REPAIR Right    AC joint  . TONSILLECTOMY    . VASECTOMY       Current Meds  Medication Sig  . aspirin EC 81 MG tablet Take 1 tablet (81 mg total) by mouth daily.  . fexofenadine (ALLEGRA) 180 MG tablet Take 180 mg by mouth daily.  . rosuvastatin (CRESTOR) 20 MG tablet Take 20 mg by mouth at bedtime.      Allergies:   Patient has no known allergies.   Social History   Tobacco Use  . Smoking status: Never Smoker  . Smokeless tobacco: Never Used  Substance Use Topics  . Alcohol use: Yes    Comment: rare-social  . Drug use: No     Family Hx: The patient's family history includes CAD in his father and paternal grandmother.  ROS:   Please see the history of present illness.    No Fever, chills  or productive cough All other systems reviewed and are negative.  Wt Readings from Last 3 Encounters:  09/28/20 195 lb (88.5 kg)  06/24/19 201 lb (91.2 kg)  10/27/18 200 lb (90.7 kg)     Objective:    Vital Signs:  Ht 6\' 1"  (1.854 m)   Wt 195 lb (88.5 kg)   BMI 25.73  kg/m    VITAL SIGNS:  reviewed NAD Answers questions appropriately Normal affect Remainder of physical examination not performed (telehealth visit; coronavirus pandemic)  ASSESSMENT & PLAN:    1. Coronary calcification-patient denies chest pain.  Continue aspirin and statin. 2. Aortic insufficiency-mild on most recent echocardiogram.  We will plan repeat study July 2023. 3. Hyperlipidemia-continue statin.  COVID-19 Education: The importance of social distancing was discussed today.  Time:   Today, I have spent 15 minutes with the patient with telehealth technology discussing the above problems.     Medication  Adjustments/Labs and Tests Ordered: Current medicines are reviewed at length with the patient today.  Concerns regarding medicines are outlined above.   Tests Ordered: No orders of the defined types were placed in this encounter.   Medication Changes: No orders of the defined types were placed in this encounter.   Follow Up:  In Person in 1 year(s)  Signed, Kirk Ruths, MD  09/28/2020 8:40 AM    [place]

## 2020-09-28 ENCOUNTER — Telehealth (INDEPENDENT_AMBULATORY_CARE_PROVIDER_SITE_OTHER): Payer: 59 | Admitting: Cardiology

## 2020-09-28 ENCOUNTER — Encounter: Payer: Self-pay | Admitting: Cardiology

## 2020-09-28 VITALS — Ht 73.0 in | Wt 195.0 lb

## 2020-09-28 DIAGNOSIS — E7800 Pure hypercholesterolemia, unspecified: Secondary | ICD-10-CM | POA: Diagnosis not present

## 2020-09-28 DIAGNOSIS — I351 Nonrheumatic aortic (valve) insufficiency: Secondary | ICD-10-CM | POA: Diagnosis not present

## 2020-09-28 DIAGNOSIS — R0600 Dyspnea, unspecified: Secondary | ICD-10-CM

## 2020-09-28 NOTE — Patient Instructions (Signed)

## 2020-10-06 ENCOUNTER — Encounter: Payer: Self-pay | Admitting: *Deleted

## 2022-03-28 IMAGING — CT CT CHEST W/O CM
2 of 4 series · 15 of 36 positions shown, 18 images · non-contrast
Comparison: 11/17/2018

CLINICAL DATA: Follow-up lung nodule.

EXAM:
CT CHEST WITHOUT CONTRAST
TECHNIQUE: Multidetector CT imaging of the chest was performed following the
standard protocol without IV contrast.

[Series 4: lungs · axial · 0.78mm/px · z∈[-420,-72]mm · 12 of 196 slices shown, 15 images]
[im 11/196  mediastinal]
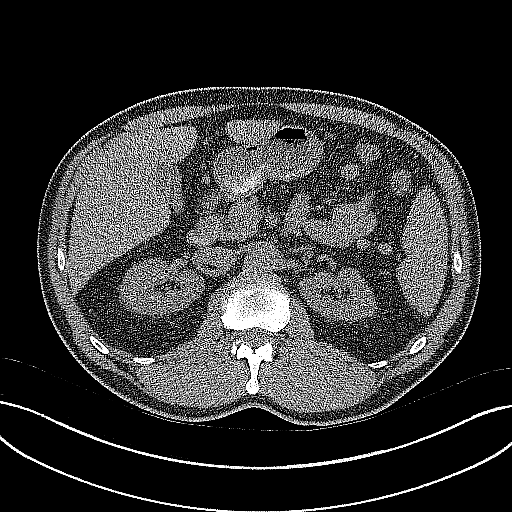
[im 11/196  lung]
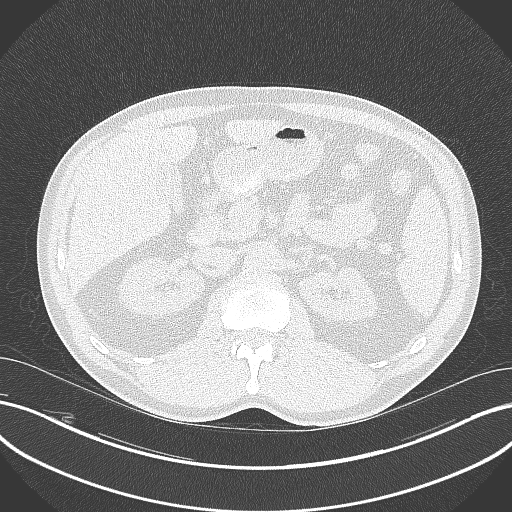
[im 31/196  lung]
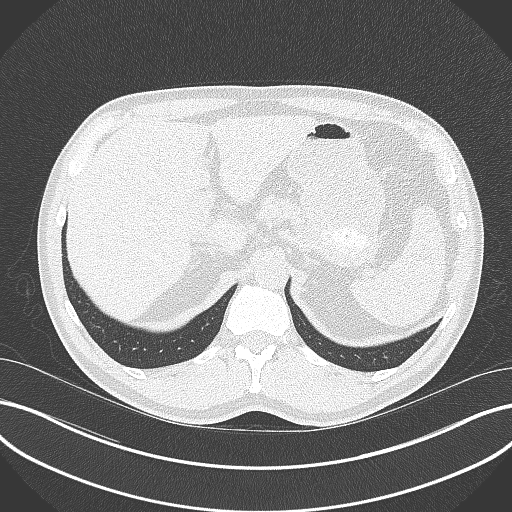
[im 42/196  lung]
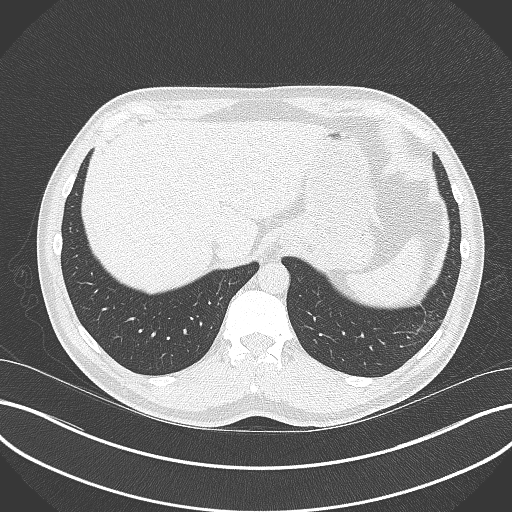
[im 62/196  lung]
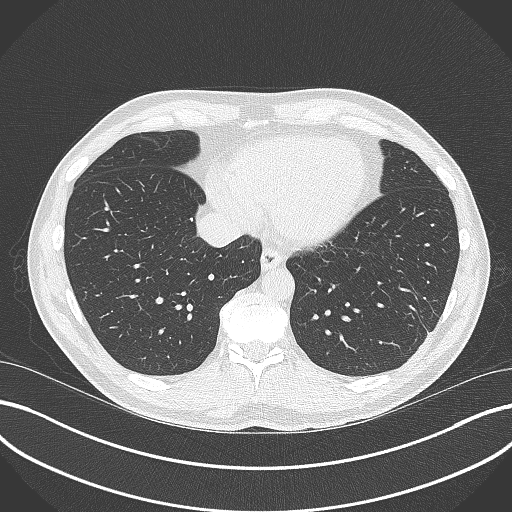
[im 72/196  mediastinal]
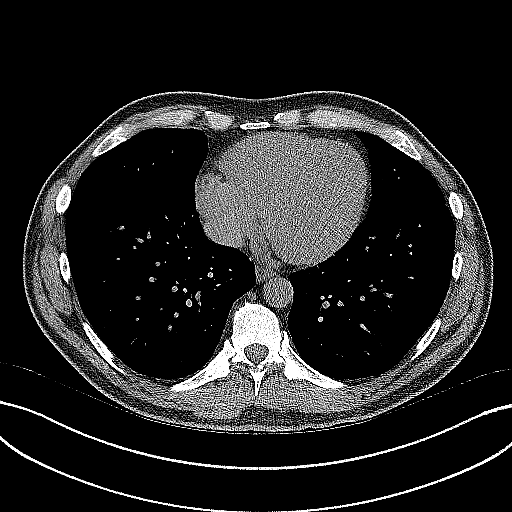
[im 72/196  lung]
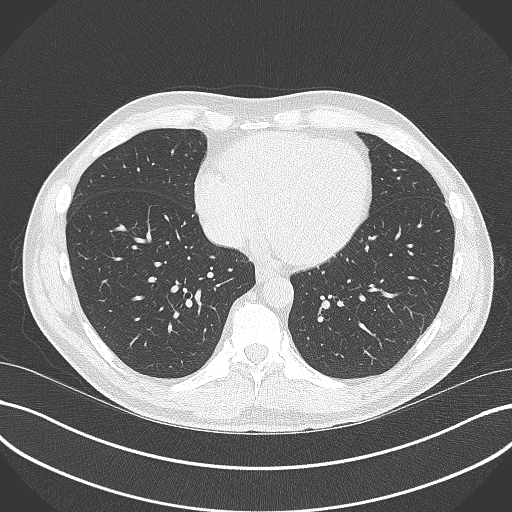
[im 93/196  lung]
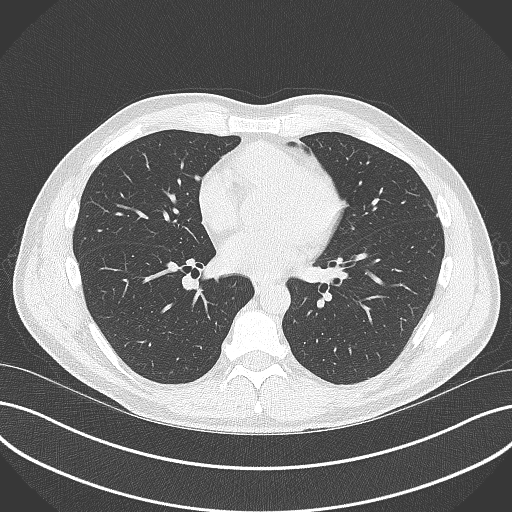
[im 103/196  lung]
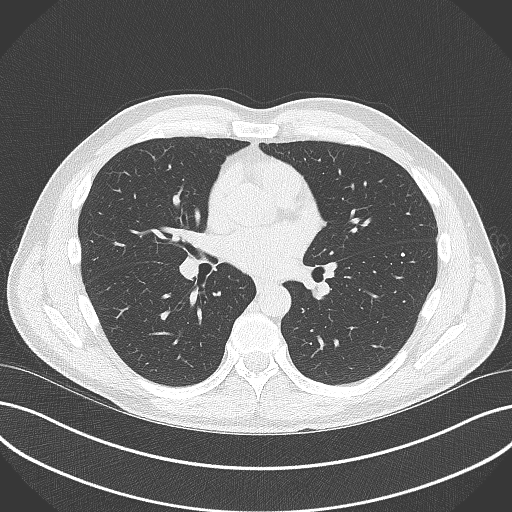
[im 124/196  lung]
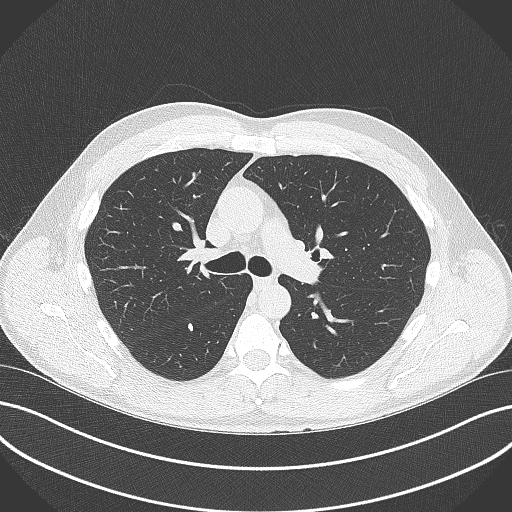
[im 134/196  mediastinal]
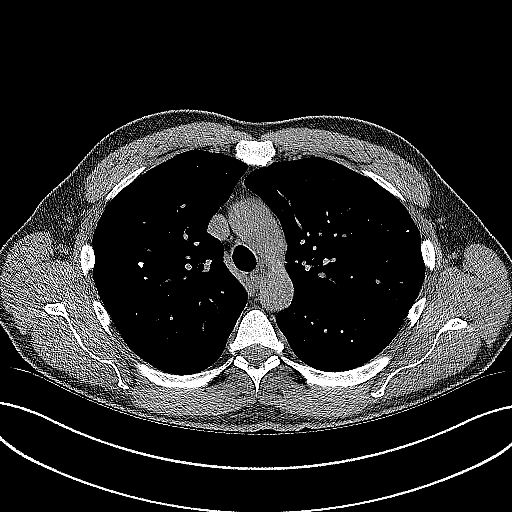
[im 134/196  lung]
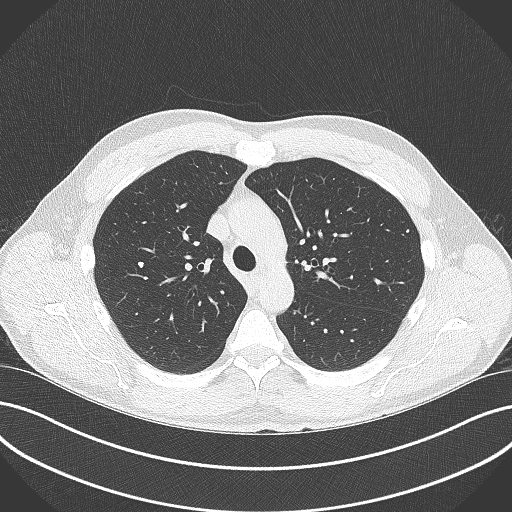
[im 154/196  lung]
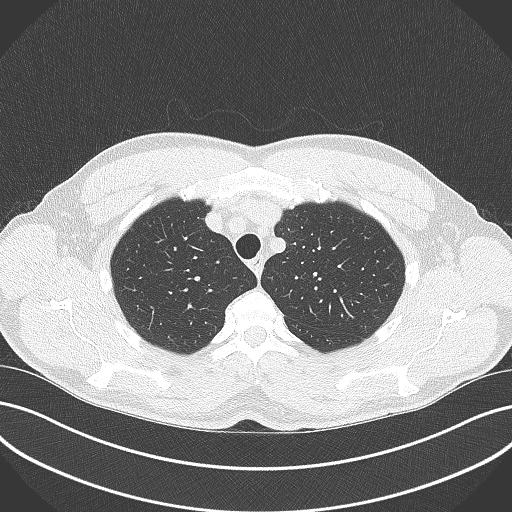
[im 165/196  lung]
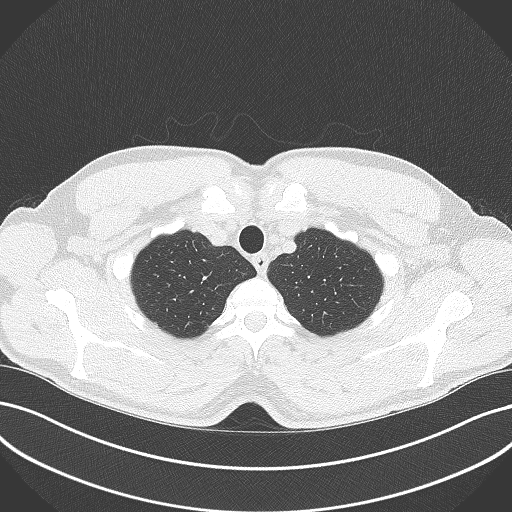
[im 185/196  lung]
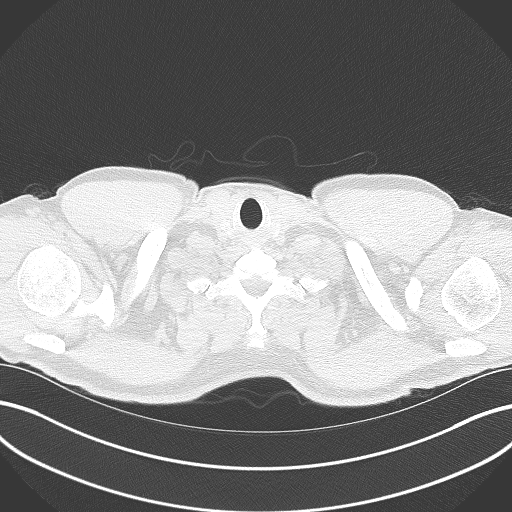

[Series 5: coronal · coronal · 0.76mm/px · 3 of 150 slices shown]
[im 30/150  lung]
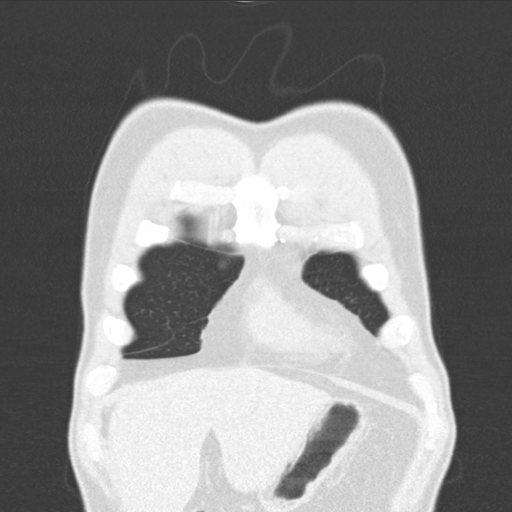
[im 60/150  lung]
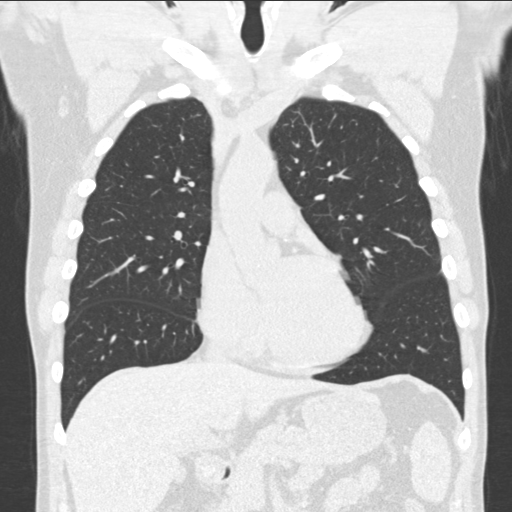
[im 90/150  lung]
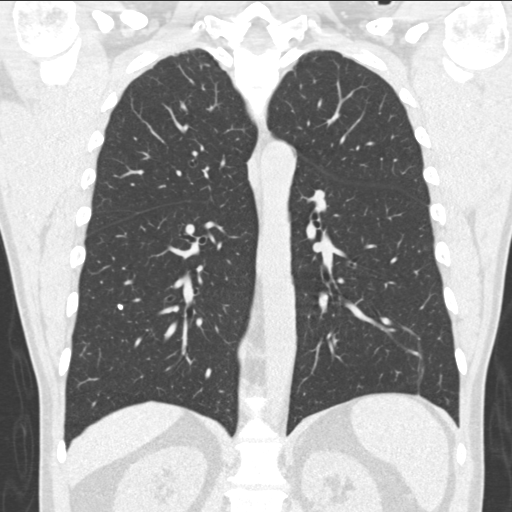

[15 of 36 positions shown; findings below may reference images not displayed]

FINDINGS: Cardiovascular: Normal heart size.  No pericardial effusion.

Mediastinum/Nodes: No enlarged mediastinal or axillary lymph nodes.
Thyroid gland, trachea, and esophagus demonstrate no significant
findings.

Lungs/Pleura: No pleural effusion, airspace consolidation, or
atelectasis. Scar in the posterolateral left base is unchanged.
Numerous tiny calcified lung nodules are again seen scattered
throughout both lungs. No suspicious lung nodules identified at this
time.

Upper Abdomen: No acute abnormality.

Musculoskeletal: No chest wall mass or suspicious bone lesions
identified.
IMPRESSION: 1. No acute cardiopulmonary abnormalities.
2. Unchanged appearance of numerous small calcified lung nodules
compatible with prior granulomatous disease. No suspicious lung
nodules identified at this time.

## 2024-06-15 ENCOUNTER — Other Ambulatory Visit: Payer: Self-pay | Admitting: Family Medicine

## 2024-06-15 DIAGNOSIS — E7800 Pure hypercholesterolemia, unspecified: Secondary | ICD-10-CM

## 2024-06-21 ENCOUNTER — Other Ambulatory Visit
